# Patient Record
Sex: Female | Born: 1975
Health system: Southern US, Community
[De-identification: ages and names within clinical notes are randomized; demographics above are authoritative.]

## PROBLEM LIST (undated history)

## (undated) DIAGNOSIS — K2970 Gastritis, unspecified, without bleeding: Principal | ICD-10-CM

## (undated) DIAGNOSIS — B9681 Helicobacter pylori [H. pylori] as the cause of diseases classified elsewhere: Secondary | ICD-10-CM

## (undated) DIAGNOSIS — L309 Dermatitis, unspecified: Secondary | ICD-10-CM

## (undated) DIAGNOSIS — K219 Gastro-esophageal reflux disease without esophagitis: Secondary | ICD-10-CM

## (undated) HISTORY — DX: Helicobacter pylori (H. pylori) as the cause of diseases classified elsewhere: B96.81

## (undated) HISTORY — DX: Gastritis, unspecified, without bleeding: K29.70

## (undated) HISTORY — DX: Gastro-esophageal reflux disease without esophagitis: K21.9

## (undated) HISTORY — DX: Dermatitis, unspecified: L30.9

## (undated) HISTORY — PX: OTHER SURGICAL HISTORY: SHX169

---

## 2013-01-02 ENCOUNTER — Encounter: Payer: Self-pay | Admitting: Family Medicine

## 2013-01-02 ENCOUNTER — Ambulatory Visit (INDEPENDENT_AMBULATORY_CARE_PROVIDER_SITE_OTHER): Payer: BC Managed Care – PPO | Admitting: Family Medicine

## 2013-01-02 VITALS — BP 90/60 | HR 94 | Temp 99.2°F | Ht 58.75 in | Wt 100.8 lb

## 2013-01-02 DIAGNOSIS — L259 Unspecified contact dermatitis, unspecified cause: Secondary | ICD-10-CM

## 2013-01-02 DIAGNOSIS — M62830 Muscle spasm of back: Secondary | ICD-10-CM | POA: Insufficient documentation

## 2013-01-02 DIAGNOSIS — L309 Dermatitis, unspecified: Secondary | ICD-10-CM | POA: Insufficient documentation

## 2013-01-02 DIAGNOSIS — M5380 Other specified dorsopathies, site unspecified: Secondary | ICD-10-CM

## 2013-01-02 MED ORDER — CYCLOBENZAPRINE HCL 10 MG PO TABS: 10.0000 mg | ORAL_TABLET | Freq: Three times a day (TID) | ORAL | Status: DC | PRN

## 2013-01-02 MED ORDER — TRIAMCINOLONE ACETONIDE 0.1 % EX OINT: TOPICAL_OINTMENT | Freq: Two times a day (BID) | CUTANEOUS | Status: DC

## 2013-01-02 MED ORDER — MELOXICAM 15 MG PO TABS: 15.0000 mg | ORAL_TABLET | Freq: Every day | ORAL | Status: DC

## 2013-01-02 NOTE — Assessment & Plan Note (Signed)
New.  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  Start scheduled NSAIDs, muscle relaxer prn.  Heating pad prn.  Encouraged better posture if possible throughout the day.  Will follow.

## 2013-01-02 NOTE — Patient Instructions (Addendum)
Schedule your complete physical in 1-2 months Start the Triamcinolone ointment on the dry skin patches Use the Meloxicam daily for the back pain Use the flexeril (muscle relaxer) at night for pain and muscle spasm Use a heating pad for pain relief Try and improve your posture during the day- I know it's hard w/ work! Call with any questions or concerns Welcome!  We're glad to have you!

## 2013-01-02 NOTE — Progress Notes (Signed)
  Subjective:    Patient ID: Heather Caldwell, female    DOB: 10/16/1975, 37 y.o.   MRN: [id number]  HPI New to establish.  No previous MD.  Back pain- sxs started 'a long time ago' but recently worsened.  Will wake pt from sleep.  Located upper lumbar/lower thoracic.  Intermittent during the days but constant at night.  Worse w/ lying down.  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  Has not tried anything for pain.  Painful to bend forward.  No pain w/ extension.  Pain will radiate up into L shoulder.  Itchy skin- rash present, on bilateral buttocks, present x10 yrs.  No redness or drainage.  Has patches elsewhere.  Review of Systems For ROS see HPI     Objective:   Physical Exam  Vitals reviewed. Constitutional: She appears well-developed and well-nourished. No distress.  Cardiovascular: Normal rate, regular rhythm and normal heart sounds.   Pulmonary/Chest: Effort normal and breath sounds normal. No respiratory distress. She has no wheezes. She has no rales.  Musculoskeletal: She exhibits no edema.  + paraspinal spasm Pain w/ forward flexion, no pain w/ extension  Neurological: She has normal reflexes. No cranial nerve deficit. Coordination normal.  (-) SLR bilaterally  Skin: Skin is warm and dry.  Dry, scaly patches on buttock bilaterally, R upper arm, feet bilaterally  Psychiatric: She has a normal mood and affect. Her behavior is normal.          Assessment & Plan:

## 2013-01-02 NOTE — Assessment & Plan Note (Signed)
New.  Pt's dry, patchy skin consistent w/ eczema.  Start topical steroid ointment.  Reviewed supportive care and red flags that should prompt return.  Pt expressed understanding and is in agreement w/ plan.

## 2013-01-27 ENCOUNTER — Encounter (HOSPITAL_COMMUNITY): Payer: Self-pay | Admitting: *Deleted

## 2013-01-27 ENCOUNTER — Emergency Department (HOSPITAL_COMMUNITY)
Admission: EM | Admit: 2013-01-27 | Discharge: 2013-01-27 | Disposition: A | Discharge status: Home / Self Care | Payer: BC Managed Care – PPO | Source: Home / Self Care

## 2013-01-27 DIAGNOSIS — K209 Esophagitis, unspecified without bleeding: Secondary | ICD-10-CM

## 2013-01-27 DIAGNOSIS — K219 Gastro-esophageal reflux disease without esophagitis: Secondary | ICD-10-CM

## 2013-01-27 DIAGNOSIS — R1013 Epigastric pain: Secondary | ICD-10-CM

## 2013-01-27 LAB — POCT I-STAT, CHEM 8
BUN: 8 mg/dL (ref 6–23)
Calcium, Ion: 1.27 mmol/L — ABNORMAL HIGH (ref 1.12–1.23)
Chloride: 102 mEq/L (ref 96–112)
Glucose, Bld: 81 mg/dL (ref 70–99)

## 2013-01-27 LAB — POCT URINALYSIS DIP (DEVICE)
Ketones, ur: NEGATIVE mg/dL
Urobilinogen, UA: 0.2 mg/dL (ref 0.0–1.0)

## 2013-01-27 MED ORDER — OMEPRAZOLE 20 MG PO CPDR: 20.0000 mg | DELAYED_RELEASE_CAPSULE | Freq: Two times a day (BID) | ORAL | Status: DC

## 2013-01-27 MED ORDER — RANITIDINE HCL 150 MG PO TABS: 150.0000 mg | ORAL_TABLET | Freq: Two times a day (BID) | ORAL | Status: DC

## 2013-01-27 MED ORDER — ONDANSETRON HCL 4 MG PO TABS: 4.0000 mg | ORAL_TABLET | Freq: Four times a day (QID) | ORAL | Status: DC

## 2013-01-27 NOTE — ED Provider Notes (Signed)
History    CSN: [id number] Arrival date & time 01/27/13  1027  None    Chief Complaint  Patient presents with  . Abdominal Pain   (Consider location/radiation/quality/duration/timing/severity/associated sxs/prior Treatment) HPI Comments: 37 year old female from [place] is accompanied by a friend to interpret for her. Her chief complaint is that of high epigastric pain for greater than a year. In the past 2 weeks  getting worse. 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 She been treated with meloxicam and this seems to have made her epigastric pain worse. There is no radiation of pain. Is accompanied with nausea but no vomiting. Food tends to make it worse and when she feels hungry that tends to make it worse. She complains of acid reflux and heartburn moving up substernally. She is taking no medications for the symptoms. She complains of decreased appetite, early satiety and no vomiting. She denies urinary symptoms. She has had decreased intake of by mouth fluids and food and is feeling tired.  History reviewed. No pertinent past medical history. History reviewed. No pertinent past surgical history. No family history on file. History  Substance Use Topics  . Smoking status: Never Smoker   . Smokeless tobacco: Not on file  . Alcohol Use: No   OB History   Grav Para Term Preterm Abortions TAB SAB Ect Mult Living                 Review of Systems  Constitutional: Positive for activity change and fatigue. Negative for fever and chills.  HENT: Negative.   Respiratory: Negative.   Cardiovascular:       As above.  Gastrointestinal: Positive for nausea and abdominal pain. Negative for vomiting, diarrhea, blood in stool and abdominal distention.  Genitourinary: Negative for dysuria, frequency, hematuria and vaginal bleeding.  Musculoskeletal: Positive for back pain.  Skin: Negative.    Neurological: Positive for light-headedness. Negative for tremors, seizures, syncope, facial asymmetry and speech difficulty.    Allergies  Review of patient's allergies indicates no known allergies.  Home Medications   Current Outpatient Rx  Name  Route  Sig  Dispense  Refill  . triamcinolone ointment (KENALOG) 0.1 %   Topical   Apply topically 2 (two) times daily.   90 g   1   . cyclobenzaprine (FLEXERIL) 10 MG tablet   Oral   Take 1 tablet (10 mg total) by mouth 3 (three) times daily as needed for muscle spasms.   45 tablet   1   . meloxicam (MOBIC) 15 MG tablet   Oral   Take 1 tablet (15 mg total) by mouth daily.   30 tablet   0   . omeprazole (PRILOSEC) 20 MG capsule   Oral   Take 1 capsule (20 mg total) by mouth 2 (two) times daily.   40 capsule   0   . ondansetron (ZOFRAN) 4 MG tablet   Oral   Take 1 tablet (4 mg total) by mouth every 6 (six) hours.   12 tablet   0   . ranitidine (ZANTAC) 150 MG tablet   Oral   Take 1 tablet (150 mg total) by mouth 2 (two) times daily.   40 tablet   0    BP 108/80  Pulse 105  Temp(Src) 98.2 F (36.8 C) (Oral)  SpO2 100%  LMP 12/20/2012 Physical Exam  Nursing note and  vitals reviewed. Constitutional: She is oriented to person, place, and time. She appears well-developed and well-nourished. No distress.  Eyes: Conjunctivae and EOM are normal.  Neck: Normal range of motion. Neck supple.  Cardiovascular: Normal rate, regular rhythm and normal heart sounds.   Pulmonary/Chest: Effort normal and breath sounds normal. No respiratory distress. She has no wheezes.  Abdominal: Soft. She exhibits no distension and no mass. There is no rebound and no guarding.  Mild to moderate tenderness palpating the high epigastrium. No tenderness along the medial costal margins or the xiphoid. No tenderness in other areas of the abdomen. The abdomen is flat and symmetrical.  Musculoskeletal: She exhibits no edema and no tenderness.   Lymphadenopathy:    She has no cervical adenopathy.  Neurological: She is alert and oriented to person, place, and time. She exhibits normal muscle tone.  Skin: Skin is warm and dry.  Psychiatric: She has a normal mood and affect.    ED Course  Procedures (including critical care time) Labs Reviewed  POCT URINALYSIS DIP (DEVICE) - Abnormal; Notable for the following:    Hgb urine dipstick TRACE (*)    Leukocytes, UA TRACE (*)    All other components within normal limits  POCT I-STAT, CHEM 8 - Abnormal; Notable for the following:    Calcium, Ion 1.27 (*)    All other components within normal limits  POCT PREGNANCY, URINE   Results for orders placed during the hospital encounter of 01/27/13  POCT URINALYSIS DIP (DEVICE)      Result Value Range   Glucose, UA NEGATIVE  NEGATIVE mg/dL   Bilirubin Urine NEGATIVE  NEGATIVE   Ketones, ur NEGATIVE  NEGATIVE mg/dL   Specific Gravity, Urine 1.010  1.005 - 1.030   Hgb urine dipstick TRACE (*) NEGATIVE   pH 6.5  5.0 - 8.0   Protein, ur NEGATIVE  NEGATIVE mg/dL   Urobilinogen, UA 0.2  0.0 - 1.0 mg/dL   Nitrite NEGATIVE  NEGATIVE   Leukocytes, UA TRACE (*) NEGATIVE  POCT PREGNANCY, URINE      Result Value Range   Preg Test, Ur NEGATIVE  NEGATIVE  POCT I-STAT, CHEM 8      Result Value Range   Sodium 141  135 - 145 mEq/L   Potassium 3.6  3.5 - 5.1 mEq/L   Chloride 102  96 - 112 mEq/L   BUN 8  6 - 23 mg/dL   Creatinine, Ser 4.09  0.50 - 1.10 mg/dL   Glucose, Bld 81  70 - 99 mg/dL   Calcium, Ion 8.11 (*) 1.12 - 1.23 mmol/L   TCO2 24  0 - 100 mmol/L   Hemoglobin 13.3  12.0 - 15.0 g/dL   HCT 91.4  78.2 - 95.6 %    No results found. 1. Epigastric abdominal pain   2. GERD (gastroesophageal reflux disease)   3. Esophagitis     MDM  I suspect the patient has reflux esophagitis associated with some form of gastritis. She will be given a prescription for ranitidine 150 mg twice a day plus omeprazole 20 mg twice a day and Zantac 1  tablet every 6-8 hours as needed for nausea. She is encouraged to drink clear fluids stay well hydrated. Slowly advance diet as tolerated. No spicy, greasy or fast foods. Followup with your doctor as scheduled next week. Recommend he be referred to gastroenterology. He also may need additional blood testing, such as H. pylori. For any bleeding or new symptoms or severe pain or  fever may return.  Hayden Rasmussen, NP 01/27/13 1030

## 2013-01-27 NOTE — ED Notes (Signed)
Patient complains of abdominal pain and vomiting x 1 year.

## 2013-01-28 NOTE — ED Provider Notes (Signed)
Medical screening examination/treatment/procedure(s) were performed by resident physician or non-physician practitioner and as supervising physician I was immediately available for consultation/collaboration.   KINDL,JAMES DOUGLAS MD.   James D Kindl, MD 01/28/13 1340 

## 2013-03-04 ENCOUNTER — Encounter: Payer: Self-pay | Admitting: Family Medicine

## 2013-03-04 ENCOUNTER — Encounter: Payer: Self-pay | Admitting: General Practice

## 2013-03-04 ENCOUNTER — Other Ambulatory Visit (HOSPITAL_COMMUNITY)
Admission: RE | Admit: 2013-03-04 | Discharge: 2013-03-04 | Disposition: A | Discharge status: Home / Self Care | Payer: BC Managed Care – PPO | Source: Ambulatory Visit | Attending: Family Medicine | Admitting: Family Medicine

## 2013-03-04 ENCOUNTER — Ambulatory Visit (INDEPENDENT_AMBULATORY_CARE_PROVIDER_SITE_OTHER): Payer: BC Managed Care – PPO | Admitting: Family Medicine

## 2013-03-04 VITALS — BP 92/62 | HR 71 | Temp 98.0°F | Ht 58.75 in | Wt 102.0 lb

## 2013-03-04 DIAGNOSIS — Z1151 Encounter for screening for human papillomavirus (HPV): Secondary | ICD-10-CM | POA: Insufficient documentation

## 2013-03-04 DIAGNOSIS — K219 Gastro-esophageal reflux disease without esophagitis: Secondary | ICD-10-CM

## 2013-03-04 DIAGNOSIS — Z01419 Encounter for gynecological examination (general) (routine) without abnormal findings: Secondary | ICD-10-CM | POA: Insufficient documentation

## 2013-03-04 DIAGNOSIS — Z0000 Encounter for general adult medical examination without abnormal findings: Secondary | ICD-10-CM

## 2013-03-04 DIAGNOSIS — Z124 Encounter for screening for malignant neoplasm of cervix: Secondary | ICD-10-CM

## 2013-03-04 DIAGNOSIS — Z1331 Encounter for screening for depression: Secondary | ICD-10-CM

## 2013-03-04 LAB — HEPATIC FUNCTION PANEL
ALT: 18 U/L (ref 0–35)
AST: 21 U/L (ref 0–37)
Albumin: 3.8 g/dL (ref 3.5–5.2)
Alkaline Phosphatase: 49 U/L (ref 39–117)
Bilirubin, Direct: 0.1 mg/dL (ref 0.0–0.3)
Total Bilirubin: 0.7 mg/dL (ref 0.3–1.2)
Total Protein: 7.2 g/dL (ref 6.0–8.3)

## 2013-03-04 LAB — CBC WITH DIFFERENTIAL/PLATELET
Eosinophils Relative: 10 % — ABNORMAL HIGH (ref 0.0–5.0)
HCT: 34.9 % — ABNORMAL LOW (ref 36.0–46.0)
Hemoglobin: 11.9 g/dL — ABNORMAL LOW (ref 12.0–15.0)
Lymphs Abs: 1.6 10*3/uL (ref 0.7–4.0)
MCV: 92.7 fl (ref 78.0–100.0)
Monocytes Absolute: 0.4 10*3/uL (ref 0.1–1.0)
Neutro Abs: 4.1 10*3/uL (ref 1.4–7.7)
Platelets: 189 10*3/uL (ref 150.0–400.0)
RDW: 13 % (ref 11.5–14.6)
WBC: 6.9 10*3/uL (ref 4.5–10.5)

## 2013-03-04 LAB — LIPID PANEL
Cholesterol: 138 mg/dL (ref 0–200)
HDL: 32.2 mg/dL — ABNORMAL LOW (ref >39.00)
Total CHOL/HDL Ratio: 4
Triglycerides: 86 mg/dL (ref 0.0–149.0)

## 2013-03-04 LAB — BASIC METABOLIC PANEL
Calcium: 8.6 mg/dL (ref 8.4–10.5)
Creatinine, Ser: 0.5 mg/dL (ref 0.4–1.2)
GFR: 146.69 mL/min (ref >60.00)
Glucose, Bld: 95 mg/dL (ref 70–99)
Sodium: 134 mEq/L — ABNORMAL LOW (ref 135–145)

## 2013-03-04 LAB — TSH: TSH: 0.83 u[IU]/mL (ref 0.35–5.50)

## 2013-03-04 MED ORDER — TRIAMCINOLONE ACETONIDE 0.1 % EX OINT: TOPICAL_OINTMENT | Freq: Two times a day (BID) | CUTANEOUS | Status: DC

## 2013-03-04 MED ORDER — PANTOPRAZOLE SODIUM 40 MG PO TBEC: 40.0000 mg | DELAYED_RELEASE_TABLET | Freq: Every day | ORAL | Status: DC

## 2013-03-04 NOTE — Progress Notes (Signed)
  Subjective:    Patient ID: Heather Caldwell, female    DOB: [birth year], 37 y.o.   MRN: [id number]  HPI CPE- has never had pap smear   Review of Systems Patient reports no vision/ hearing changes, adenopathy,fever, weight change,  persistant/recurrent hoarseness , swallowing issues, chest pain, palpitations, edema, persistant/recurrent cough, hemoptysis, dyspnea (rest/exertional/paroxysmal nocturnal), gastrointestinal bleeding (melena, rectal bleeding), abdominal pain, bowel changes, GU symptoms (dysuria, hematuria, incontinence), Gyn symptoms (abnormal  bleeding, pain),  syncope, focal weakness, memory loss, numbness & tingling, hair/nail changes, abnormal bruising or bleeding, anxiety, or depression.  + GERD + Eczema    Objective:   Physical Exam  General Appearance:    Alert, cooperative, no distress, appears stated age  Head:    Normocephalic, without obvious abnormality, atraumatic  Eyes:    PERRL, conjunctiva/corneas clear, EOM's intact, fundi    benign, both eyes  Ears:    Normal TM's and external ear canals, both ears  Nose:   Nares normal, septum midline, mucosa normal, no drainage    or sinus tenderness  Throat:   Lips, mucosa, and tongue normal; teeth and gums normal  Neck:   Supple, symmetrical, trachea midline, no adenopathy;    Thyroid: no enlargement/tenderness/nodules  Back:     Symmetric, no curvature, ROM normal, no CVA tenderness  Lungs:     Clear to auscultation bilaterally, respirations unlabored  Chest Wall:    No tenderness or deformity   Heart:    Regular rate and rhythm, S1 and S2 normal, no murmur, rub   or gallop  Breast Exam:    No tenderness, masses, or nipple abnormality  Abdomen:     Soft, non-tender, bowel sounds active all four quadrants,    no masses, no organomegaly  Genitalia:    External genitalia normal, cervix friable/erythematous, no CMT, uterus in normal size and position, adnexa w/out mass or tenderness, mucosa pink and moist, no lesions or  discharge present  Rectal:    Normal external appearance  Extremities:   Extremities normal, atraumatic, no cyanosis or edema  Pulses:   2+ and symmetric all extremities  Skin:   Very dry, eczematous  Lymph nodes:   Cervical, supraclavicular, and axillary nodes normal  Neurologic:   CNII-XII intact, normal strength, sensation and reflexes    throughout          Assessment & Plan:

## 2013-03-04 NOTE — Assessment & Plan Note (Signed)
Pap collected. 

## 2013-03-04 NOTE — Patient Instructions (Addendum)
Start the Protonix daily for the stomach problems Use the Triamcinolone ointment on the dry skin twice daily We'll call you with your GI appt We'll notify you of your lab results and make any changes if needed Call with any questions or concerns Enjoy the rest of your summer!

## 2013-03-04 NOTE — Assessment & Plan Note (Signed)
Pt's PE WNL w/ exception of abnormal appearing cervix.  Pap collected.  Check labs.  Anticipatory guidance provided.

## 2013-03-04 NOTE — Assessment & Plan Note (Signed)
No improvement w/ Zantac or OTC Omeprazole.  Start Protonix daily.  Refer to GI for ongoing discomfort.

## 2013-03-06 ENCOUNTER — Encounter: Payer: Self-pay | Admitting: General Practice

## 2013-03-08 LAB — VITAMIN D 1,25 DIHYDROXY: Vitamin D2 1, 25 (OH)2: <8 pg/mL

## 2013-03-11 ENCOUNTER — Encounter: Payer: Self-pay | Admitting: *Deleted

## 2013-03-13 ENCOUNTER — Encounter: Payer: Self-pay | Admitting: Internal Medicine

## 2013-04-22 ENCOUNTER — Encounter: Payer: Self-pay | Admitting: Internal Medicine

## 2013-04-22 ENCOUNTER — Ambulatory Visit (INDEPENDENT_AMBULATORY_CARE_PROVIDER_SITE_OTHER): Payer: BC Managed Care – PPO | Admitting: Internal Medicine

## 2013-04-22 ENCOUNTER — Other Ambulatory Visit (INDEPENDENT_AMBULATORY_CARE_PROVIDER_SITE_OTHER): Payer: BC Managed Care – PPO

## 2013-04-22 VITALS — BP 102/62 | HR 80 | Ht 59.5 in | Wt 102.0 lb

## 2013-04-22 DIAGNOSIS — R110 Nausea: Secondary | ICD-10-CM

## 2013-04-22 DIAGNOSIS — G8929 Other chronic pain: Secondary | ICD-10-CM

## 2013-04-22 DIAGNOSIS — R1013 Epigastric pain: Secondary | ICD-10-CM

## 2013-04-22 LAB — AMYLASE: Amylase: 140 U/L — ABNORMAL HIGH (ref 27–131)

## 2013-04-22 LAB — LIPASE: Lipase: 26 U/L (ref 11.0–59.0)

## 2013-04-22 NOTE — Patient Instructions (Addendum)
Your physician has requested that you go to the basement for the following lab work before leaving today: Amylase, Lipase  You have been scheduled for an endoscopy with propofol. Please follow written instructions given to you at your visit today. If you use inhalers (even only as needed), please bring them with you on the day of your procedure. Your physician has requested that you go to www.startemmi.com and enter the access code given to you at your visit today. This web site gives a general overview about your procedure. However, you should still follow specific instructions given to you by our office regarding your preparation for the procedure.  You have been scheduled for an abdominal ultrasound at [facility] (1st floor of hospital) on 04/24/13 at 8:00am. Please arrive 15 minutes prior to your appointment for registration. Make certain not to have anything to eat or drink 6 hours prior to your appointment. Should you need to reschedule your appointment, please contact radiology at [phone]. This test typically takes about 30 minutes to perform.  It is the time of year to have a vaccination to prevent the flu (influenza virus).  Please have this done through your primary care provider or you can get this done at local pharmacies or the [facility]. It would be very helpful if you notify your primary care provider when and where you had the vaccination given by messaging them in My Chart, leaving a message or faxing the information.   I appreciate the opportunity to care for you.

## 2013-04-22 NOTE — Progress Notes (Signed)
Subjective:    Patient ID: Heather Caldwell, female    DOB: 1976/01/30, 37 y.o.   MRN: [id number]  HPI Is a very nice middle-aged [place] woman here with an interpreter. She is at a 3 year history of epigastric discomfort and heartburn. She also is complaining of some mid to lower back pain it does not seem to be associated with that. She saw Dr. to worry recently and Protonix was prescribed, the patient at this for about 3 weeks but stopped because it did not seem to help. The pain is intermittent and not associated with eating necessarily but can occur with that. She denies any significant stress or anxiety. She does have a 3 month history of intermittent headache which can make her feel nauseous and be associated with the pain but she's had these symptoms for 3 years prior to this. She might have lost a couple of pounds but in general has gone issue. Appetite is normal to slightly off. Her menses are regular her last menstrual period was September 17. She immigrated to this country 3 years ago. Her GI review of systems is otherwise negative, bowel habits are regular. No Known Allergies Outpatient Prescriptions Prior to Visit  Medication Sig Dispense Refill        . triamcinolone ointment (KENALOG) 0.1 % Apply topically 2 (two) times daily.  90 g  1   No facility-administered medications prior to visit.   Past Medical History  Diagnosis Date  . GERD (gastroesophageal reflux disease)     ??  . Eczema    History reviewed. No pertinent past surgical history. History   Social History  . Marital Status: Single    Spouse Name: N/A    Number of Children:  2 sons    Social History Main Topics  . Smoking status: Never Smoker   . Smokeless tobacco: Never Used  . Alcohol Use: No  . Drug Use: No    Social History Narrative   Single, 2 sons.   Employed as a Advertising account planner.   Family History  Problem Relation Age of Onset  . Family history unknown: Yes   Review of Systems As per  history of present illness. She's also complaining some itching on the left hip area. She does have some painful menstrual periods, fatigue. All other systems negative or as per history of present illness.    Objective:   Physical Exam General:  Well-developed, well-nourished and in no acute distress - petite Asian woman Eyes:  anicteric. ENT:   Mouth and posterior pharynx free of lesions.  Neck:   supple w/o thyromegaly or mass.  Lungs: Clear to auscultation bilaterally. Heart:  S1S2, no rubs, murmurs, gallops. Abdomen:  soft, mildly tender epigastrium no hepatosplenomegaly, hernia, or mass and BS+.  Lymph:  no cervical or supraclavicular adenopathy. Extremities:   no edema Skin   no rash. Neuro:  A&O x 3.  Psych:  appropriate mood and  Affect.   Data Reviewed: Primary care notes  Lab Results  Component Value Date   WBC 6.9 03/04/2013   HGB 11.9* 03/04/2013   HCT 34.9* 03/04/2013   MCV 92.7 03/04/2013   PLT 189.0 03/04/2013     Chemistry      Component Value Date/Time   NA 134* 03/04/2013 0914   K 3.8 03/04/2013 0914   CL 104 03/04/2013 0914   CO2 25 03/04/2013 0914   BUN 11 03/04/2013 0914   CREATININE 0.5 03/04/2013 0914      Component  Value Date/Time   CALCIUM 8.6 03/04/2013 0914   ALKPHOS 49 03/04/2013 0914   AST 21 03/04/2013 0914   ALT 18 03/04/2013 0914   BILITOT 0.7 03/04/2013 0914     Lab Results  Component Value Date   TSH 0.83 03/04/2013         Assessment & Plan:  Abdominal pain, chronic, epigastric  Nausea alone  1. Cause not clear - seems like dyspepsia. Severaal weeks of PPI no help. Labs have been ok except isolated slightly elevated ionized Ca last summer.  2. Will check amylase and lipase, keep additional calcium testing in mind but since repeat Ca was ok and the Ca i was just slightly high doubt that was problem 3. Abdominal US 4. EGD The risks and benefits as well as alternatives of endoscopic procedure(s) have been discussed and reviewed. All  questions answered. The patient agrees to proceed.  I appreciate the opportunity to care for this patient. CC: Neena Rhymes, MD

## 2013-04-24 ENCOUNTER — Ambulatory Visit (HOSPITAL_COMMUNITY)
Admission: RE | Admit: 2013-04-24 | Discharge: 2013-04-24 | Disposition: A | Discharge status: Home / Self Care | Payer: BC Managed Care – PPO | Source: Ambulatory Visit | Attending: Internal Medicine | Admitting: Internal Medicine

## 2013-04-24 DIAGNOSIS — R1013 Epigastric pain: Secondary | ICD-10-CM | POA: Insufficient documentation

## 2013-04-24 DIAGNOSIS — G8929 Other chronic pain: Secondary | ICD-10-CM | POA: Insufficient documentation

## 2013-04-24 NOTE — Progress Notes (Signed)
Quick Note:  [place] is ok Await egd ______

## 2013-05-13 ENCOUNTER — Ambulatory Visit (AMBULATORY_SURGERY_CENTER): Payer: BC Managed Care – PPO | Admitting: Internal Medicine

## 2013-05-13 ENCOUNTER — Encounter: Payer: Self-pay | Admitting: Internal Medicine

## 2013-05-13 VITALS — BP 100/74 | HR 77 | Temp 98.0°F | Resp 8 | Ht 59.0 in | Wt 102.0 lb

## 2013-05-13 DIAGNOSIS — R1013 Epigastric pain: Secondary | ICD-10-CM

## 2013-05-13 MED ORDER — SODIUM CHLORIDE 0.9 % IV SOLN: 500.0000 mL | INTRAVENOUS | Status: DC

## 2013-05-13 NOTE — Progress Notes (Signed)
Called to room to assist during endoscopic procedure.  Patient ID and intended procedure confirmed with present staff. Received instructions for my participation in the procedure from the performing physician.  

## 2013-05-13 NOTE — Progress Notes (Signed)
Procedure ends, to recovery, report given and VSS. 

## 2013-05-13 NOTE — Patient Instructions (Addendum)
This looked ok but I took biopsies to look for infection. We made an appointment for you to see me on the 3rd floor (my office) Nov 3 at 315 PM.  I appreciate the opportunity to care for you. Iva Boop, MD, FACG  YOU HAD AN ENDOSCOPIC PROCEDURE TODAY AT THE [facility]: Refer to the procedure report that was given to you for any specific questions about what was found during the examination.  If the procedure report does not answer your questions, please call your gastroenterologist to clarify.  If you requested that your care partner not be given the details of your procedure findings, then the procedure report has been included in a sealed envelope for you to review at your convenience later.  YOU SHOULD EXPECT: Some feelings of bloating in the abdomen. Passage of more gas than usual.  Walking can help get rid of the air that was put into your GI tract during the procedure and reduce the bloating. If you had a lower endoscopy (such as a colonoscopy or flexible sigmoidoscopy) you may notice spotting of blood in your stool or on the toilet paper. If you underwent a bowel prep for your procedure, then you may not have a normal bowel movement for a few days.  DIET: Your first meal following the procedure should be a light meal and then it is ok to progress to your normal diet.  A half-sandwich or bowl of soup is an example of a good first meal.  Heavy or fried foods are harder to digest and may make you feel nauseous or bloated.  Likewise meals heavy in dairy and vegetables can cause extra gas to form and this can also increase the bloating.  Drink plenty of fluids but you should avoid alcoholic beverages for 24 hours.  ACTIVITY: Your care partner should take you home directly after the procedure.  You should plan to take it easy, moving slowly for the rest of the day.  You can resume normal activity the day after the procedure however you should NOT DRIVE or use heavy machinery for 24  hours (because of the sedation medicines used during the test).    SYMPTOMS TO REPORT IMMEDIATELY: A gastroenterologist can be reached at any hour.  During normal business hours, 8:30 AM to 5:00 PM Monday through Friday, call [phone].  After hours and on weekends, please call the GI answering service at [phone] who will take a message and have the physician on call contact you.   Following upper endoscopy (EGD)  Vomiting of blood or coffee ground material  New chest pain or pain under the shoulder blades  Painful or persistently difficult swallowing  New shortness of breath  Fever of 100F or higher  Black, tarry-looking stools  FOLLOW UP: If any biopsies were taken you will be contacted by phone or by letter within the next 1-3 weeks.  Call your gastroenterologist if you have not heard about the biopsies in 3 weeks.  Our staff will call the home number listed on your records the next business day following your procedure to check on you and address any questions or concerns that you may have at that time regarding the information given to you following your procedure. This is a courtesy call and so if there is no answer at the home number and we have not heard from you through the emergency physician on call, we will assume that you have returned to your regular daily activities without  incident.  SIGNATURES/CONFIDENTIALITY: You and/or your care partner have signed paperwork which will be entered into your electronic medical record.  These signatures attest to the fact that that the information above on your After Visit Summary has been reviewed and is understood.  Full responsibility of the confidentiality of this discharge information lies with you and/or your care-partner.  Wait biopsy results.

## 2013-05-13 NOTE — Progress Notes (Signed)
Patient did not experience any of the following events: a burn prior to discharge; a fall within the facility; wrong site/side/patient/procedure/implant event; or a hospital transfer or hospital admission upon discharge from the facility. (G8907) Patient did not have preoperative order for IV antibiotic SSI prophylaxis. (G8918)  

## 2013-05-13 NOTE — Op Note (Signed)
[facility] [address] [place], [address]   ENDOSCOPY PROCEDURE REPORT  PATIENT: Heather, Caldwell  MR#: [id number] BIRTHDATE: February 08, 1976 , 37  yrs. old GENDER: Female ENDOSCOPIST: Iva Boop, MD, Clementeen Graham REFERRED BY:  Sheliah Hatch, M.D. PROCEDURE DATE:  05/13/2013 PROCEDURE:  EGD w/ biopsy ASA CLASS:     Class II INDICATIONS:  Epigastric pain. MEDICATIONS: Propofol (Diprivan) 180 mg IV, MAC sedation, administered by CRNA, and These medications were titrated to patient response per physician's verbal order TOPICAL ANESTHETIC: none  DESCRIPTION OF PROCEDURE: After the risks benefits and alternatives of the procedure were thoroughly explained, informed consent was obtained.  The LB [id number] endoscope was introduced through the mouth and advanced to the second portion of the duodenum. Without limitations.  The instrument was slowly withdrawn as the mucosa was fully examined.      The upper, middle and distal third of the esophagus were carefully inspected and no abnormalities were noted.  The z-line was well seen at the GEJ.  The endoscope was pushed into the fundus which was normal including a retroflexed view.  The antrum, gastric body, first and second part of the duodenum were unremarkable.  Biopsies fo CLOtestwere taken in the antrum and angularis.  Retroflexed views revealed no abnormalities.     The scope was then withdrawn from the patient and the procedure completed.  COMPLICATIONS: There were no complications. ENDOSCOPIC IMPRESSION: Normal EGD; biopsies were taken in the antrum and angularis (CLOtest) to look for H. pylori  RECOMMENDATIONS: Await biopsy results of CLO test and se me in office 05/20/2013 315 PM   eSigned:  Iva Boop, MD, [facility] 05/13/2013 3:19 PM   CC:The Patient and Sheliah Hatch, MD

## 2013-05-14 ENCOUNTER — Telehealth: Payer: Self-pay

## 2013-05-14 LAB — HELICOBACTER PYLORI SCREEN-BIOPSY: UREASE: POSITIVE

## 2013-05-14 NOTE — Telephone Encounter (Signed)
I called [phone] and spoke with the pt's sister, THU at # [phone].  She said she will call her sister and check on her.  I advised her to call us back if any questions or concerns, if not no need to call back.  She said she understood. Maw

## 2013-05-16 NOTE — Progress Notes (Signed)
Quick Note:  Will discuss and treat + H. Pylori test 11/3 She has appt  No letter/recall from [facility] ______

## 2013-05-20 ENCOUNTER — Ambulatory Visit (INDEPENDENT_AMBULATORY_CARE_PROVIDER_SITE_OTHER): Payer: BC Managed Care – PPO | Admitting: Internal Medicine

## 2013-05-20 ENCOUNTER — Encounter: Payer: Self-pay | Admitting: Internal Medicine

## 2013-05-20 VITALS — BP 90/60 | HR 88 | Ht 59.0 in | Wt 105.0 lb

## 2013-05-20 DIAGNOSIS — M545 Low back pain: Secondary | ICD-10-CM

## 2013-05-20 DIAGNOSIS — K219 Gastro-esophageal reflux disease without esophagitis: Secondary | ICD-10-CM

## 2013-05-20 DIAGNOSIS — A048 Other specified bacterial intestinal infections: Secondary | ICD-10-CM

## 2013-05-20 DIAGNOSIS — B9681 Helicobacter pylori [H. pylori] as the cause of diseases classified elsewhere: Secondary | ICD-10-CM

## 2013-05-20 HISTORY — DX: Helicobacter pylori (H. pylori) as the cause of diseases classified elsewhere: B96.81

## 2013-05-20 MED ORDER — OMEPRAZOLE 20 MG PO CPDR: 20.0000 mg | DELAYED_RELEASE_CAPSULE | Freq: Two times a day (BID) | ORAL | Status: AC

## 2013-05-20 MED ORDER — BIS SUBCIT-METRONID-TETRACYC 140-125-125 MG PO CAPS: 3.0000 | ORAL_CAPSULE | Freq: Three times a day (TID) | ORAL | Status: DC

## 2013-05-20 NOTE — Progress Notes (Signed)
  Subjective:    Patient ID: Heather Caldwell, female    DOB: [birth year], 37 y.o.   MRN: [id number]  HPI Patient is here with an interpreter. I saw her previously for epigastric pain, upper GI endoscopy has revealed H. pyloric gastritis. She still has her pain. 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 Medications, allergies, past medical history, past surgical history, family history and social history are reviewed and updated in the EMR.   Review of Systems As per history of present illness    Objective:   Physical Exam Well-developed Asian woman in no acute distress       Assessment & Plan:  Pylera prescription and given these instructions Followup as needed See PCP about back problems back exercises given

## 2013-05-20 NOTE — Patient Instructions (Signed)
Please make an appointment with Dr Beverely Low for your back pain.  Take pylera as prescribed.  Take omeprazole as prescribed.  Thank you for the opportunity to care for you.

## 2013-05-21 DIAGNOSIS — M545 Low back pain: Secondary | ICD-10-CM | POA: Insufficient documentation

## 2013-05-21 NOTE — Assessment & Plan Note (Signed)
If she truly has this could worsen with H. Pylori Rx

## 2013-05-21 NOTE — Assessment & Plan Note (Signed)
Will treat with Pylera and bid PPI x 10 days. She is to return if not better (prn) [place] Pylera instructions provided

## 2013-05-21 NOTE — Assessment & Plan Note (Signed)
Exercise info given See PCP

## 2013-06-20 ENCOUNTER — Ambulatory Visit: Payer: BC Managed Care – PPO | Admitting: Internal Medicine

## 2013-12-11 ENCOUNTER — Ambulatory Visit (INDEPENDENT_AMBULATORY_CARE_PROVIDER_SITE_OTHER): Payer: BC Managed Care – PPO | Admitting: Family Medicine

## 2013-12-11 ENCOUNTER — Encounter: Payer: Self-pay | Admitting: Family Medicine

## 2013-12-11 VITALS — BP 100/72 | HR 82 | Temp 98.0°F | Resp 16 | Wt 106.5 lb

## 2013-12-11 DIAGNOSIS — M545 Low back pain, unspecified: Secondary | ICD-10-CM

## 2013-12-11 MED ORDER — CYCLOBENZAPRINE HCL 10 MG PO TABS: 10.0000 mg | ORAL_TABLET | Freq: Three times a day (TID) | ORAL | Status: DC | PRN

## 2013-12-11 MED ORDER — MELOXICAM 15 MG PO TABS: 15.0000 mg | ORAL_TABLET | Freq: Every day | ORAL | Status: DC

## 2013-12-11 NOTE — Patient Instructions (Signed)
Follow up as needed Start the Meloxicam daily for pain and inflammation Use the cyclobenzaprine at night for spasm- will cause drowsiness If no improvement in pain in the next 2 weeks, please call and we'll send you to ortho for further evaluation Call with any questions or concerns Hang in there!

## 2013-12-11 NOTE — Assessment & Plan Note (Signed)
Chronic problem for pt.  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  Encouraged stretching, attention to posture.  Will start daily NSAIDs and muscle relaxer prn.  If no improvement will refer to ortho for additional evaluation/tx.  Pt expressed understanding and is in agreement w/ plan.

## 2013-12-11 NOTE — Progress Notes (Signed)
Pre visit review using our clinic review tool, if applicable. No additional management support is needed unless otherwise documented below in the visit note. 

## 2013-12-11 NOTE — Progress Notes (Signed)
   Subjective:    Patient ID: Heather Caldwell, female    DOB: 08/14/1975, 38 y.o.   MRN: [id number]  HPI LBP- sxs started 3 yrs ago.  Pain is constant but worse after menses.  Pain worse w/ lying flat.  Pain improves w/ IcyHot patches.  No radiation of pain into butt or legs.  Does not take pain meds or NSAIDs for this.  Some L thigh pain w/ position changes.   Review of Systems For ROS see HPI     Objective:   Physical Exam  Vitals reviewed. Constitutional: She is oriented to person, place, and time. She appears well-developed and well-nourished. No distress.  Cardiovascular: Intact distal pulses.   Musculoskeletal: She exhibits no edema.  No TTP over spine + TTP over L paraspinal muscles and lat Pain w/ back extension > flexion  Neurological: She is alert and oriented to person, place, and time. She has normal reflexes. No cranial nerve deficit. Coordination normal.  (-) SLR bilaterally  Skin: Skin is warm and dry.          Assessment & Plan:

## 2016-04-26 ENCOUNTER — Encounter (HOSPITAL_COMMUNITY): Payer: Self-pay

## 2016-04-26 ENCOUNTER — Inpatient Hospital Stay (HOSPITAL_COMMUNITY)
Admission: AD | Admit: 2016-04-26 | Discharge: 2016-04-26 | Disposition: A | Discharge status: Home / Self Care | Payer: BLUE CROSS/BLUE SHIELD | Source: Ambulatory Visit | Attending: Obstetrics and Gynecology | Admitting: Obstetrics and Gynecology

## 2016-04-26 DIAGNOSIS — N920 Excessive and frequent menstruation with regular cycle: Secondary | ICD-10-CM | POA: Diagnosis not present

## 2016-04-26 DIAGNOSIS — N939 Abnormal uterine and vaginal bleeding, unspecified: Secondary | ICD-10-CM | POA: Diagnosis present

## 2016-04-26 LAB — URINALYSIS, ROUTINE W REFLEX MICROSCOPIC
Bilirubin Urine: NEGATIVE
GLUCOSE, UA: NEGATIVE mg/dL
KETONES UR: NEGATIVE mg/dL
LEUKOCYTES UA: NEGATIVE
Nitrite: NEGATIVE
PROTEIN: NEGATIVE mg/dL
Specific Gravity, Urine: 1.015 (ref 1.005–1.030)
pH: 6.5 (ref 5.0–8.0)

## 2016-04-26 LAB — CBC
HCT: 32.4 % — ABNORMAL LOW (ref 36.0–46.0)
Hemoglobin: 11.1 g/dL — ABNORMAL LOW (ref 12.0–15.0)
MCH: 31.3 pg (ref 26.0–34.0)
MCHC: 34.3 g/dL (ref 30.0–36.0)
MCV: 91.3 fL (ref 78.0–100.0)
PLATELETS: 161 10*3/uL (ref 150–400)
RBC: 3.55 MIL/uL — AB (ref 3.87–5.11)
RDW: 12.7 % (ref 11.5–15.5)
WBC: 4.8 10*3/uL (ref 4.0–10.5)

## 2016-04-26 LAB — URINE MICROSCOPIC-ADD ON: WBC, UA: NONE SEEN WBC/hpf (ref 0–5)

## 2016-04-26 LAB — WET PREP, GENITAL
Clue Cells Wet Prep HPF POC: NONE SEEN
Sperm: NONE SEEN
Trich, Wet Prep: NONE SEEN
YEAST WET PREP: NONE SEEN

## 2016-04-26 LAB — POCT PREGNANCY, URINE: PREG TEST UR: NEGATIVE

## 2016-04-26 NOTE — MAU Note (Signed)
Pt states her periods have been heavier than normal for the last two years.  She soaks through a pad every one or two hours.  She has large clots and they last for about 5-6 days.  She says her head and back hurts with it as well.

## 2016-04-26 NOTE — Discharge Instructions (Signed)
Rong huy?t (Metrorrhagia) Rong huy?t l tnh tr?ng ch?y mu t? t? cung x?y ra khng theo chu k? nh?ng th??ng xuyn. Tnh tr?ng ch?y mu th??ng x?y ra ? kho?ng gi?a cc k? kinh nguy?t. H??NG D?N CH?M [place] T?I NH Ch  ??n nh?ng thay ??i v? tri?u ch?ng c?a qu v?. Lm theo nh?ng h??ng d?n sau ?? gip c?i thi?n tnh tr?ng c?a qu v?: ?n  ?n nh?ng th?c ?n cn b?ng. ?n nh?ng th?c ?n giu ch?t s?t, ch?ng h?n nh? gan, th?t, ??ng v?t c v?, rau l xanh v tr?ng.  N?u qu v? b? to bn:  U?ng th?t nhi?u n??c.  ?n nh?ng lo?i tri cy v rau c nhi?u n??c v ch?t x?, ch?ng h?n rau bina (spinach), c r?t, qu? mm xi, to v xoi. Thu?c  Ch? s? d?ng thu?c khng c?n k ??n v thu?c c?n k ??n theo ch? d?n c?a chuyn gia ch?m [place] s?c kh?e.  Khng thay ??i thu?c m khng h?i  ki?n c?a chuyn gia ch?m [place] s?c kh?e.  Aspirin ho?c cc thu?c c ch?a aspirin c th? lm ch?y mu n?ng h?n. Khng dng nh?ng lo?i thu?c ?:  Trong tu?n tr??c khi ??n k? kinh nguy?t.  Trong k? kinh nguy?t.  N?u qu v? ???c k vin thu?c s?t, hy dng thu?c theo ch? d?n c?a chuyn gia ch?m [place] s?c kh?e. Vin thu?c s?t gip thay th? l??ng s?t m c? th? qu v? ? b? m?t do tnh tr?ng ny. Ho?t ??ng  N?u qu v? c?n thay b?ng v? sinh ho?c nt b?ng v? sinh nhi?u l?n trong m?i 2 gi?:  N?m trn gi??ng v k cao chn (nng cao).  ??t m?t ti ch??m l?nh ln ph?n b?ng d??i c?a qu v?.  Ngh? ng?i cng nhi?u cng t?t cho ??n khi ch?y mu d?ng l?i ho?c ch?m l?i.  Khng c? g?ng gi?m cn cho ??n khi tnh tr?ng ch?y mu d?ng l?i v n?ng ?? s?t trong mu c?a qu v? tr? l?i bnh th??ng. Nh?ng h??ng d?n khc  Trong hai thng, ghi l?i:  Khi no chu k? kinh nguy?n c?a qu v? b?t ??u.  Khi no chu k? kinh nguy?t c?a qu v? k?t thc.  Khi no vi?c ch?y mu b?t th??ng x?y ra.  Qu v? nh?n th?y v?n ?? g.  Tun th? t?t c? cc cu?c h?n khm l?i theo ch? d?n c?a chuyn gia ch?m [place] s?c kh?e. ?i?u ny c vai tr quan tr?ng. ?I KHM  N?U:  Qu v? b? chong vng ho?c y?u ?t.  Qu v? b? bu?n nn v nn m?a.  Qu v? khng th? ?n ho?c u?ng m khng b? nn.  Qu v? c?m th?y chng m?t ho?c b? tiu ch?y khi qu v? ?ang dng thu?c.  Qu v? ?ang dng vin thu?c trnh [place] ho?c hoc-mn, v qu v? mu?n thay th? ho?c d?ng s? d?ng chng. NGAY L?P T?C ?I KHM N?U:  Qu v? b? s?t ho?c ?n l?nh.  Qu v? c?n thay b?ng v? sinh ho?c nt b?ng v? sinh ho?c nt b?ng v? sinh nhi?u l?n trong m?i gi?:  Tnh tr?ng ch?y mutr? thnh n?ng.  Dng mu c?a qu v? c ch?a cc c?c mu ?ng.  Qu v? b? ?au b?ng.  Qu v? b? b?t t?nh.  Qu v? b? pht ban.   Thng tin ny khng nh?m m?c ?ch thay th? cho l?i khuyn m chuyn gia ch?m [place] s?c kh?e ni v?i qu v?. Hy b?o ??m  qu v? ph?i th?o lu?n b?t k? v?n ?? g m qu v? c v?i chuyn gia ch?m [place] s?c kh?e c?a qu v?.   Document Released: 03/16/2011 Document Revised: 03/25/2015 Elsevier Interactive Patient Education [employer]

## 2016-04-26 NOTE — MAU Provider Note (Signed)
History     CSN: [id number]  Arrival date and time: 04/26/16 [id number]   First Provider Initiated Contact with Patient 04/26/16 1014      Chief Complaint  Patient presents with  . Vaginal Bleeding   G3P2012, non-pregnant female here with c/o heavy menses x2 years. She reports menses q20 days with 5-6 days of flow. Flow is heavy days 1-3 with changing overnight pads q2 hrs then changing regular pads q3 hrs days 5-6. She reports passing some quarter sized clots. She denies cramping and pain. LMP was 5 days ago. She is sexually active. She is not using contraception. She denies vaginal discharge, itching, and odor. She reports HA and back pain associated with meses. She uses OTC meds for her HA. [facility]     Past Medical History:  Diagnosis Date  . Eczema   . GERD (gastroesophageal reflux disease)    ??  . Helicobacter pylori gastritis 05/20/2013    Past Surgical History:  Procedure Laterality Date  . none      Family History  Problem Relation Age of Onset  . Colon cancer Neg Hx   . Esophageal cancer Neg Hx   . Stomach cancer Neg Hx     Social History  Substance Use Topics  . Smoking status: Never Smoker  . Smokeless tobacco: Never Used  . Alcohol use No    Allergies: No Known Allergies  Prescriptions Prior to Admission  Medication Sig Dispense Refill Last Dose  . cyclobenzaprine (FLEXERIL) 10 MG tablet Take 1 tablet (10 mg total) by mouth 3 (three) times daily as needed for muscle spasms. 45 tablet 1   . meloxicam (MOBIC) 15 MG tablet Take 1 tablet (15 mg total) by mouth daily. 30 tablet 1     Review of Systems  Constitutional: Negative.   Gastrointestinal: Negative.   Musculoskeletal: Positive for back pain.  Neurological: Positive for headaches.   Physical Exam   Blood pressure 100/69, pulse 71, temperature 98.4 F (36.9 C), temperature source Oral, resp. rate 16.  Physical Exam  Constitutional: She is  oriented to person, place, and time. She appears well-developed and well-nourished.  HENT:  Head: Normocephalic and atraumatic.  Neck: Normal range of motion. Neck supple.  Cardiovascular: Normal rate.   Respiratory: Effort normal.  GI: Soft. She exhibits no distension. There is no tenderness.  Genitourinary:  Genitourinary Comments: External: no lesions Vagina: rugated, parous, scant bloody discharge, surface of cervix noted to bleed easy after gentle contact with speculum, ectropian present Uterus: non enlarged, anteverted, non tender, no CMT Adnexae: no masses, no tenderness left, no tenderness right   Musculoskeletal: Normal range of motion.  Neurological: She is alert and oriented to person, place, and time.  Skin: Skin is warm and dry.  Psychiatric: She has a normal mood and affect.   Results for orders placed or performed during the hospital encounter of 04/26/16 (from the past 24 hour(s))  Urinalysis, Routine w reflex microscopic (not [facility])     Status: Abnormal   Collection Time: 04/26/16  9:30 AM  Result Value Ref Range   Color, Urine YELLOW YELLOW   APPearance CLEAR CLEAR   Specific Gravity, Urine 1.015 1.005 - 1.030   pH 6.5 5.0 - 8.0   Glucose, UA NEGATIVE NEGATIVE mg/dL   Hgb urine dipstick MODERATE (A) NEGATIVE   Bilirubin Urine NEGATIVE NEGATIVE   Ketones, ur NEGATIVE NEGATIVE mg/dL   Protein, ur NEGATIVE NEGATIVE  mg/dL   Nitrite NEGATIVE NEGATIVE   Leukocytes, UA NEGATIVE NEGATIVE  Urine microscopic-add on     Status: Abnormal   Collection Time: 04/26/16  9:30 AM  Result Value Ref Range   Squamous Epithelial / LPF 0-5 (A) NONE SEEN   WBC, UA NONE SEEN 0 - 5 WBC/hpf   RBC / HPF 6-30 0 - 5 RBC/hpf   Bacteria, UA RARE (A) NONE SEEN  Pregnancy, urine POC     Status: None   Collection Time: 04/26/16 10:11 AM  Result Value Ref Range   Preg Test, Ur NEGATIVE NEGATIVE  Wet prep, genital     Status: Abnormal   Collection Time: 04/26/16 10:30 AM  Result Value  Ref Range   Yeast Wet Prep HPF POC NONE SEEN NONE SEEN   Trich, Wet Prep NONE SEEN NONE SEEN   Clue Cells Wet Prep HPF POC NONE SEEN NONE SEEN   WBC, Wet Prep HPF POC FEW (A) NONE SEEN   Sperm NONE SEEN   CBC     Status: Abnormal   Collection Time: 04/26/16 10:31 AM  Result Value Ref Range   WBC 4.8 4.0 - 10.5 K/uL   RBC 3.55 (L) 3.87 - 5.11 MIL/uL   Hemoglobin 11.1 (L) 12.0 - 15.0 g/dL   HCT 16.1 (L) 09.6 - 04.5 %   MCV 91.3 78.0 - 100.0 fL   MCH 31.3 26.0 - 34.0 pg   MCHC 34.3 30.0 - 36.0 g/dL   RDW 40.9 81.1 - 91.4 %   Platelets 161 150 - 400 K/uL    MAU Course  Procedures  MDM Labs ordered and reviewed. No evidence of anemia. No evidence of acute pelvic process. Stable for discharge home.  Assessment and Plan   1. Menorrhagia with regular cycle    Discharge home Pelvic US outpatient Follow up with [facility] in 1-2 weeks   Donette Larry, CNM 04/26/2016, 10:48 AM

## 2016-04-27 LAB — GC/CHLAMYDIA PROBE AMP (~~LOC~~) NOT AT ARMC
Chlamydia: NEGATIVE
Neisseria Gonorrhea: NEGATIVE

## 2016-05-09 ENCOUNTER — Ambulatory Visit (HOSPITAL_COMMUNITY)
Admission: RE | Admit: 2016-05-09 | Discharge: 2016-05-09 | Disposition: A | Discharge status: Home / Self Care | Payer: BLUE CROSS/BLUE SHIELD | Source: Ambulatory Visit | Attending: Certified Nurse Midwife | Admitting: Certified Nurse Midwife

## 2016-05-09 DIAGNOSIS — N920 Excessive and frequent menstruation with regular cycle: Secondary | ICD-10-CM | POA: Diagnosis present

## 2016-06-01 ENCOUNTER — Emergency Department (HOSPITAL_BASED_OUTPATIENT_CLINIC_OR_DEPARTMENT_OTHER): Payer: BLUE CROSS/BLUE SHIELD

## 2016-06-01 ENCOUNTER — Encounter (HOSPITAL_BASED_OUTPATIENT_CLINIC_OR_DEPARTMENT_OTHER): Payer: Self-pay

## 2016-06-01 ENCOUNTER — Emergency Department (HOSPITAL_BASED_OUTPATIENT_CLINIC_OR_DEPARTMENT_OTHER)
Admission: EM | Admit: 2016-06-01 | Discharge: 2016-06-01 | Disposition: A | Discharge status: Home / Self Care | Payer: BLUE CROSS/BLUE SHIELD | Attending: Emergency Medicine | Admitting: Emergency Medicine

## 2016-06-01 DIAGNOSIS — R1013 Epigastric pain: Secondary | ICD-10-CM | POA: Insufficient documentation

## 2016-06-01 DIAGNOSIS — R519 Headache, unspecified: Secondary | ICD-10-CM

## 2016-06-01 DIAGNOSIS — R0602 Shortness of breath: Secondary | ICD-10-CM | POA: Diagnosis not present

## 2016-06-01 DIAGNOSIS — R51 Headache: Secondary | ICD-10-CM | POA: Diagnosis not present

## 2016-06-01 DIAGNOSIS — R05 Cough: Secondary | ICD-10-CM | POA: Diagnosis present

## 2016-06-01 DIAGNOSIS — M545 Low back pain, unspecified: Secondary | ICD-10-CM

## 2016-06-01 DIAGNOSIS — R059 Cough, unspecified: Secondary | ICD-10-CM

## 2016-06-01 DIAGNOSIS — R112 Nausea with vomiting, unspecified: Secondary | ICD-10-CM | POA: Diagnosis not present

## 2016-06-01 LAB — CBC WITH DIFFERENTIAL/PLATELET
Basophils Absolute: 0 10*3/uL (ref 0.0–0.1)
Basophils Relative: 0 %
Eosinophils Absolute: 0 10*3/uL (ref 0.0–0.7)
Eosinophils Relative: 0 %
HCT: 31.9 % — ABNORMAL LOW (ref 36.0–46.0)
Hemoglobin: 10.7 g/dL — ABNORMAL LOW (ref 12.0–15.0)
Lymphocytes Relative: 7 %
Lymphs Abs: 0.5 10*3/uL — ABNORMAL LOW (ref 0.7–4.0)
MCH: 31.7 pg (ref 26.0–34.0)
MCHC: 33.5 g/dL (ref 30.0–36.0)
MCV: 94.4 fL (ref 78.0–100.0)
Monocytes Absolute: 0.3 10*3/uL (ref 0.1–1.0)
Monocytes Relative: 4 %
Neutro Abs: 6.7 10*3/uL (ref 1.7–7.7)
Neutrophils Relative %: 89 %
Platelets: 190 10*3/uL (ref 150–400)
RBC: 3.38 MIL/uL — ABNORMAL LOW (ref 3.87–5.11)
RDW: 12.4 % (ref 11.5–15.5)
WBC: 7.5 10*3/uL (ref 4.0–10.5)

## 2016-06-01 LAB — COMPREHENSIVE METABOLIC PANEL
ALT: 20 U/L (ref 14–54)
AST: 25 U/L (ref 15–41)
Albumin: 3.9 g/dL (ref 3.5–5.0)
Alkaline Phosphatase: 43 U/L (ref 38–126)
Anion gap: 6 (ref 5–15)
BUN: 11 mg/dL (ref 6–20)
CO2: 21 mmol/L — ABNORMAL LOW (ref 22–32)
Calcium: 8.4 mg/dL — ABNORMAL LOW (ref 8.9–10.3)
Chloride: 106 mmol/L (ref 101–111)
Creatinine, Ser: 0.45 mg/dL (ref 0.44–1.00)
GFR calc Af Amer: >60 mL/min (ref >60)
GFR calc non Af Amer: >60 mL/min (ref >60)
Glucose, Bld: 106 mg/dL — ABNORMAL HIGH (ref 65–99)
Potassium: 3.4 mmol/L — ABNORMAL LOW (ref 3.5–5.1)
Sodium: 133 mmol/L — ABNORMAL LOW (ref 135–145)
Total Bilirubin: 0.3 mg/dL (ref 0.3–1.2)
Total Protein: 7.1 g/dL (ref 6.5–8.1)

## 2016-06-01 LAB — URINALYSIS, ROUTINE W REFLEX MICROSCOPIC
Bilirubin Urine: NEGATIVE
GLUCOSE, UA: NEGATIVE mg/dL
Hgb urine dipstick: NEGATIVE
Ketones, ur: NEGATIVE mg/dL
NITRITE: NEGATIVE
PROTEIN: NEGATIVE mg/dL
Specific Gravity, Urine: 1.022 (ref 1.005–1.030)
pH: 8 (ref 5.0–8.0)

## 2016-06-01 LAB — PREGNANCY, URINE: Preg Test, Ur: NEGATIVE

## 2016-06-01 LAB — URINE MICROSCOPIC-ADD ON

## 2016-06-01 LAB — LIPASE, BLOOD: Lipase: 23 U/L (ref 11–51)

## 2016-06-01 MED ORDER — ONDANSETRON HCL 4 MG/2ML IJ SOLN
4.0000 mg | Freq: Once | INTRAMUSCULAR | Status: AC
  Administered 2016-06-01: 4 mg via INTRAVENOUS
  Filled 2016-06-01: qty 2

## 2016-06-01 MED ORDER — IBUPROFEN 800 MG PO TABS: 800.0000 mg | ORAL_TABLET | Freq: Three times a day (TID) | ORAL | 0 refills | Status: AC

## 2016-06-01 MED ORDER — BENZONATATE 100 MG PO CAPS: 100.0000 mg | ORAL_CAPSULE | Freq: Three times a day (TID) | ORAL | 0 refills | Status: AC

## 2016-06-01 MED ORDER — SODIUM CHLORIDE 0.9 % IV BOLUS (SEPSIS)
1000.0000 mL | Freq: Once | INTRAVENOUS | Status: AC
  Administered 2016-06-01: 1000 mL via INTRAVENOUS

## 2016-06-01 MED ORDER — ONDANSETRON HCL 4 MG PO TABS: 4.0000 mg | ORAL_TABLET | Freq: Four times a day (QID) | ORAL | 0 refills | Status: AC

## 2016-06-01 MED ORDER — SODIUM CHLORIDE 0.9 % IV BOLUS (SEPSIS): 1000.0000 mL | Freq: Once | INTRAVENOUS | Status: DC

## 2016-06-01 MED FILL — IBUPROFEN 800 MG TABLET: 800 | 7 days supply | Qty: 21 | Fill #0

## 2016-06-01 MED FILL — BENZONATATE 100 MG CAPSULE: 100 | 7 days supply | Qty: 21 | Fill #0

## 2016-06-01 MED FILL — ONDANSETRON HCL 4 MG TABLET: 4 | 3 days supply | Qty: 12 | Fill #0

## 2016-06-01 NOTE — ED Provider Notes (Signed)
[facility] Provider Note   CSN: [id number] Arrival date & time: 06/01/16  1119     History   Chief Complaint Chief Complaint  Patient presents with  . Cough    HPI Heather Caldwell is a 40 y.o. female reports a 1 day history of nonproductive cough, nausea, vomiting, sore throat, headache, abdominal pain. Patient reports associated shortness of breath. Patient reports not being able to keep anything down since this morning. Patient reports insomnia and associated chest tiredness when she has not been getting enough sleep. She has been experiencing this for the past year, but it has been worse since she has started feeling bad. Patient reports chronic low back pain that has been worse and she began vomiting. Patient has generalized fatigue. She denies any fevers, but has had chills. Patient reports epigastric pain intermittent for a year, that has been worsening since she has not been feeling well. Patient denies any recent travel or sick contacts. She denies any urinary symptoms.  HPI  Past Medical History:  Diagnosis Date  . Eczema   . GERD (gastroesophageal reflux disease)    ??  . Helicobacter pylori gastritis 05/20/2013    Patient Active Problem List   Diagnosis Date Noted  . Low back pain 05/21/2013  . Helicobacter pylori gastritis 05/20/2013  . Screening for malignant neoplasm of the cervix 03/04/2013  . Routine gynecological examination 03/04/2013  . GERD (gastroesophageal reflux disease) 03/04/2013  . Eczema 01/02/2013  . Paraspinal muscle spasm 01/02/2013    Past Surgical History:  Procedure Laterality Date  . none      OB History    Gravida Para Term Preterm AB Living   3 2 2   1 2    SAB TAB Ectopic Multiple Live Births   1               Home Medications    Prior to Admission medications   Medication Sig Start Date End Date Taking? Authorizing Provider  benzonatate (TESSALON) 100 MG capsule Take 1 capsule (100 mg total) by mouth every 8  (eight) hours. 06/01/16   Emi Holes, PA-C  ibuprofen (ADVIL,MOTRIN) 800 MG tablet Take 1 tablet (800 mg total) by mouth 3 (three) times daily. 06/01/16   Emi Holes, PA-C  Multiple Vitamin (MULTIVITAMIN WITH MINERALS) TABS tablet Take 1 tablet by mouth daily.    Historical Provider, MD  Omega-3 Fatty Acids (FISH OIL PO) Take 1 tablet by mouth daily.    Historical Provider, MD  ondansetron (ZOFRAN) 4 MG tablet Take 1 tablet (4 mg total) by mouth every 6 (six) hours. 06/01/16   Doranne Schmutz M Meshilem Machuca, PA-C  OVER THE COUNTER MEDICATION Take 1 tablet by mouth daily. Pt takes this medication for her eyes. (like a vitamin for the eyes)    Historical Provider, MD    Family History Family History  Problem Relation Age of Onset  . Colon cancer Neg Hx   . Esophageal cancer Neg Hx   . Stomach cancer Neg Hx     Social History Social History  Substance Use Topics  . Smoking status: Never Smoker  . Smokeless tobacco: Never Used  . Alcohol use No     Allergies   Patient has no known allergies.   Review of Systems Review of Systems  Constitutional: Positive for appetite change and chills. Negative for fever.  HENT: Negative for facial swelling and sore throat.   Respiratory: Positive for cough and shortness of breath.  Cardiovascular: Negative for chest pain.  Gastrointestinal: Positive for abdominal pain, nausea and vomiting.  Genitourinary: Negative for dysuria.  Musculoskeletal: Positive for back pain.  Skin: Negative for rash and wound.  Neurological: Positive for headaches.  Psychiatric/Behavioral: The patient is not nervous/anxious.      Physical Exam Updated Vital Signs BP 114/82   Pulse 81   Temp 98 F (36.7 C) (Oral)   Resp 20   Ht 5' (1.524 m)   Wt 45.4 kg   LMP 05/18/2016   SpO2 100%   BMI 19.53 kg/m   Physical Exam  Constitutional: She appears well-developed and well-nourished. No distress.  HENT:  Head: Normocephalic and atraumatic.  Mouth/Throat:  Oropharynx is clear and moist. No oropharyngeal exudate.  Eyes: Conjunctivae and EOM are normal. Pupils are equal, round, and reactive to light. Right eye exhibits no discharge. Left eye exhibits no discharge. No scleral icterus.  Neck: Normal range of motion. Neck supple. No thyromegaly present.  Cardiovascular: Normal rate, regular rhythm, normal heart sounds and intact distal pulses.  Exam reveals no gallop and no friction rub.   No murmur heard. Pulmonary/Chest: Effort normal and breath sounds normal. No stridor. No respiratory distress. She has no wheezes. She has no rales.  Abdominal: Soft. Bowel sounds are normal. She exhibits no distension. There is tenderness in the right upper quadrant, epigastric area, periumbilical area and left upper quadrant. There is no rebound, no guarding, no CVA tenderness and no tenderness at McBurney's point.  Musculoskeletal: She exhibits no edema.       Lumbar back: She exhibits tenderness and bony tenderness.       Back:  Lymphadenopathy:    She has no cervical adenopathy.  Neurological: She is alert. Coordination normal.  CN 3-12 intact; normal sensation throughout; 5/5 strength in all 4 extremities; equal bilateral grip strength   Skin: Skin is warm and dry. No rash noted. She is not diaphoretic. No pallor.  Psychiatric: She has a normal mood and affect.  Nursing note and vitals reviewed.    ED Treatments / Results  Labs (all labs ordered are listed, but only abnormal results are displayed) Labs Reviewed  URINALYSIS, ROUTINE W REFLEX MICROSCOPIC (NOT AT [facility]) - Abnormal; Notable for the following:       Result Value   Leukocytes, UA SMALL (*)    All other components within normal limits  URINE MICROSCOPIC-ADD ON - Abnormal; Notable for the following:    Squamous Epithelial / LPF 6-30 (*)    Bacteria, UA FEW (*)    All other components within normal limits  COMPREHENSIVE METABOLIC PANEL - Abnormal; Notable for the following:    Sodium 133  (*)    Potassium 3.4 (*)    CO2 21 (*)    Glucose, Bld 106 (*)    Calcium 8.4 (*)    All other components within normal limits  CBC WITH DIFFERENTIAL/PLATELET - Abnormal; Notable for the following:    RBC 3.38 (*)    Hemoglobin 10.7 (*)    HCT 31.9 (*)    Lymphs Abs 0.5 (*)    All other components within normal limits  PREGNANCY, URINE  LIPASE, BLOOD    EKG  EKG Interpretation None       Radiology Dg Chest 2 View  Result Date: 06/01/2016 CLINICAL DATA:  Cough and shortness of breath for 2 days EXAM: CHEST  2 VIEW COMPARISON:  None. FINDINGS: Lungs are clear. Heart size and pulmonary vascularity are normal. No adenopathy. No  bone lesions. IMPRESSION: No edema or consolidation. Electronically Signed   By: Bretta BangWilliam  Woodruff III M.D.   On: 06/01/2016 15:05   Ct L-spine No Charge  Result Date: 06/01/2016 CLINICAL DATA:  Lumbosacral back pain. Cough, body aches and dizziness. EXAM: CT LUMBAR SPINE WITHOUT CONTRAST TECHNIQUE: Multidetector CT imaging of the lumbar spine was performed without intravenous contrast administration. Multiplanar CT image reconstructions were also generated. Images reformatted from concurrently performed CT abdomen/pelvis without contrast, to be reported separately. COMPARISON:  None. FINDINGS: Segmentation: 5 lumbar type vertebrae. Alignment: Normal. Vertebrae: No acute fracture or focal pathologic process. No bony destructive change. Paraspinal and other soft tissues: Negative. CT abdomen/pelvis reported separately. Disc levels: Normal disc height. No evidence for significant disc protrusion. IMPRESSION: No acute osseous abnormality of the lumbar spine. Electronically Signed   By: Rubye OaksMelanie  Ehinger M.D.   On: 06/01/2016 13:59   Ct Renal Stone Study  Result Date: 06/01/2016 CLINICAL DATA:  Nonproductive cough.  Body aches. EXAM: CT ABDOMEN AND PELVIS WITHOUT CONTRAST TECHNIQUE: Multidetector CT imaging of the abdomen and pelvis was performed following the  standard protocol without IV contrast. COMPARISON:  None. FINDINGS: Lower chest: No acute abnormality. Hepatobiliary: No focal liver abnormality is seen. No gallstones, gallbladder wall thickening, or biliary dilatation. Pancreas: Unremarkable. No pancreatic ductal dilatation or surrounding inflammatory changes. Spleen: Normal in size without focal abnormality. Adrenals/Urinary Tract: Adrenal glands are unremarkable. Kidneys are normal, without renal calculi, focal lesion, or hydronephrosis. Bladder is unremarkable. Stomach/Bowel: Stomach is within normal limits. Appendix appears normal. No evidence of bowel wall thickening, distention, or inflammatory changes. Vascular/Lymphatic: No significant vascular findings are present. No enlarged abdominal or pelvic lymph nodes. Reproductive: Uterus and bilateral adnexa are unremarkable. Other: No abdominal wall hernia or abnormality. No abdominopelvic ascites. Musculoskeletal: No acute or significant osseous findings. IMPRESSION: 1. No acute abdominal or pelvic pathology. Electronically Signed   By: Elige KoHetal  Patel   On: 06/01/2016 13:56    Procedures Procedures (including critical care time)  Medications Ordered in ED Medications  sodium chloride 0.9 % bolus 1,000 mL (not administered)  sodium chloride 0.9 % bolus 1,000 mL (0 mLs Intravenous Stopped 06/01/16 1549)  ondansetron (ZOFRAN) injection 4 mg (4 mg Intravenous Given 06/01/16 1309)  ondansetron (ZOFRAN) injection 4 mg (4 mg Intravenous Given 06/01/16 1546)     Initial Impression / Assessment and Plan / ED Course  I have reviewed the triage vital signs and the nursing notes.  Pertinent labs & imaging results that were available during my care of the patient were reviewed by me and considered in my medical decision making (see chart for details).  Clinical Course     Patient with most likely viral syndrome. Patient reporting she is feeling a little better after Zofran and 2 L of fluid in the ED.  CBC shows stable chronic anemia, hemoglobin 10.7. Lipase 23. CMP shows sodium 133, potassium 3.4, CO2 21, glucose 106, calcium 8.4. CT of the abdomen renal stone study shows no acute abdominal or pelvic pathology. CT of the L-spine shows no osseous abnormality. CXR shows no edema or consolidation. UA shows small leukocytes, few bacteria, 6-30 squamous epithelial cells. Urine pregnancy negative. Patient able to tolerate fluids in the ED. Discharged home with supportive treatment including Zofran, Tessalon, ibuprofen. Follow-up to PCP for follow-up and further evaluation. Follow-up to GI for chronic upper abdominal pain. Return precautions discussed. Patient understands and agrees with plan. Patient vitals stable throughout ED course and discharged in satisfactory condition.  Final Clinical  Impressions(s) / ED Diagnoses   Final diagnoses:  Low back pain  Cough  Non-intractable vomiting with nausea, unspecified vomiting type  Acute nonintractable headache, unspecified headache type    New Prescriptions New Prescriptions   BENZONATATE (TESSALON) 100 MG CAPSULE    Take 1 capsule (100 mg total) by mouth every 8 (eight) hours.   IBUPROFEN (ADVIL,MOTRIN) 800 MG TABLET    Take 1 tablet (800 mg total) by mouth 3 (three) times daily.   ONDANSETRON (ZOFRAN) 4 MG TABLET    Take 1 tablet (4 mg total) by mouth every 6 (six) hours.     Emi Holes, PA-C 06/01/16 1551    Melene Plan, DO 06/02/16 [phone]

## 2016-06-01 NOTE — Discharge Instructions (Signed)
Medications: Zofran, Tessalon, ibuprofen  Treatment: Take Zofran every 6 hours as needed for nausea and vomiting. Take Tessalon every 8 hours as needed for cough. Take ibuprofen every 8 hours as needed for pain and body aches. Make sure to drink plenty of fluids.  Follow-up: Please follow-up with your primary care provider for follow-up of today's visit. Please follow-up with gastroenterology for further evaluation of your long-term upper abdominal pain. Please return to emergency department if you developed any new or worsening symptoms.

## 2016-06-01 NOTE — ED Triage Notes (Signed)
Per sister, pt with nonprod cough, body aches, n/v, dizziness since yesterday-sent from urgent care-negative flu-NAD-steady gait

## 2016-06-01 NOTE — ED Notes (Signed)
Patient transported to X-ray 

## 2018-02-03 IMAGING — DX DG CHEST 2V
2 series · 2 of 2 positions shown · non-contrast
Comparison: None.

CLINICAL DATA: Cough and shortness of breath for 2 days

EXAM:
CHEST  2 VIEW

[chest pa]
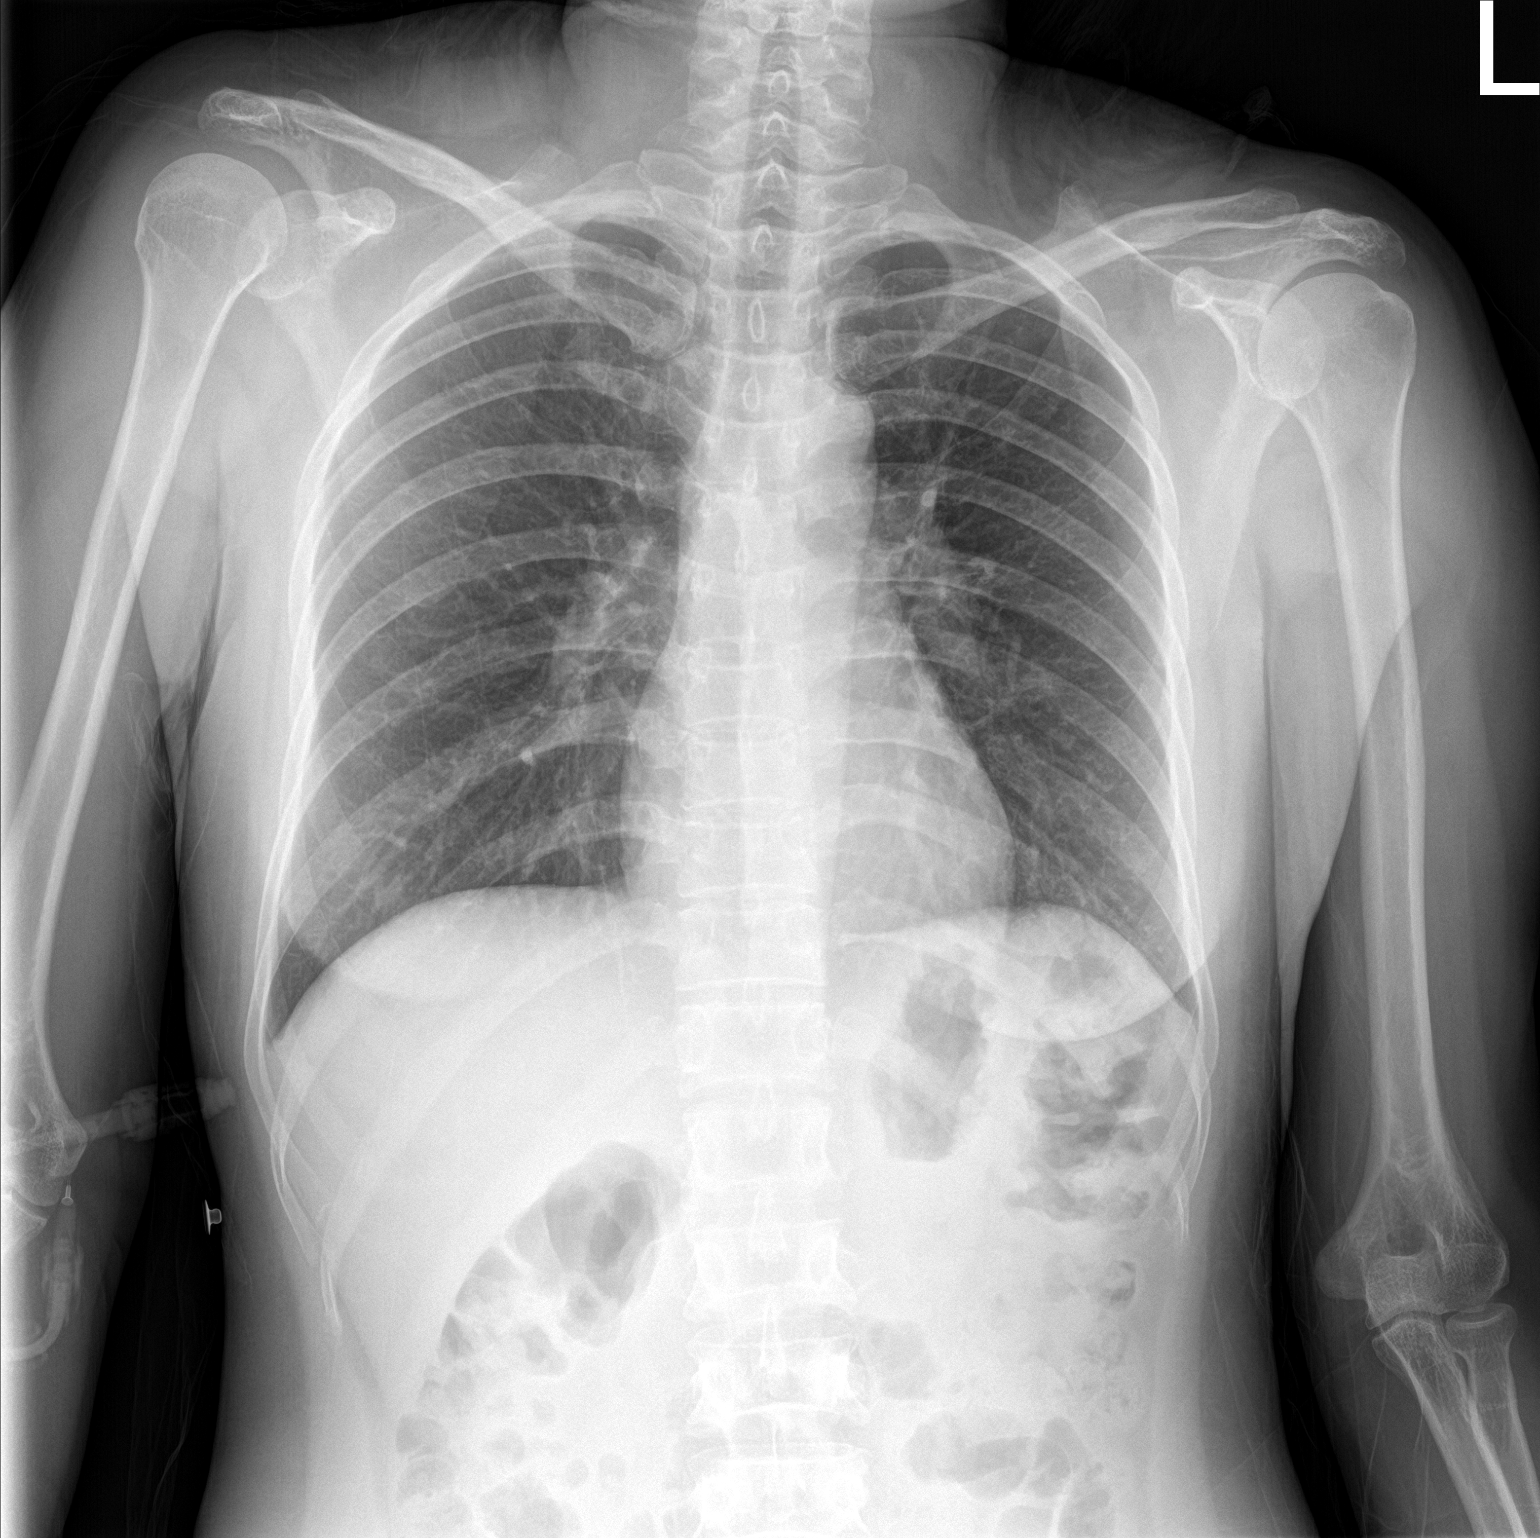

[chest lat]
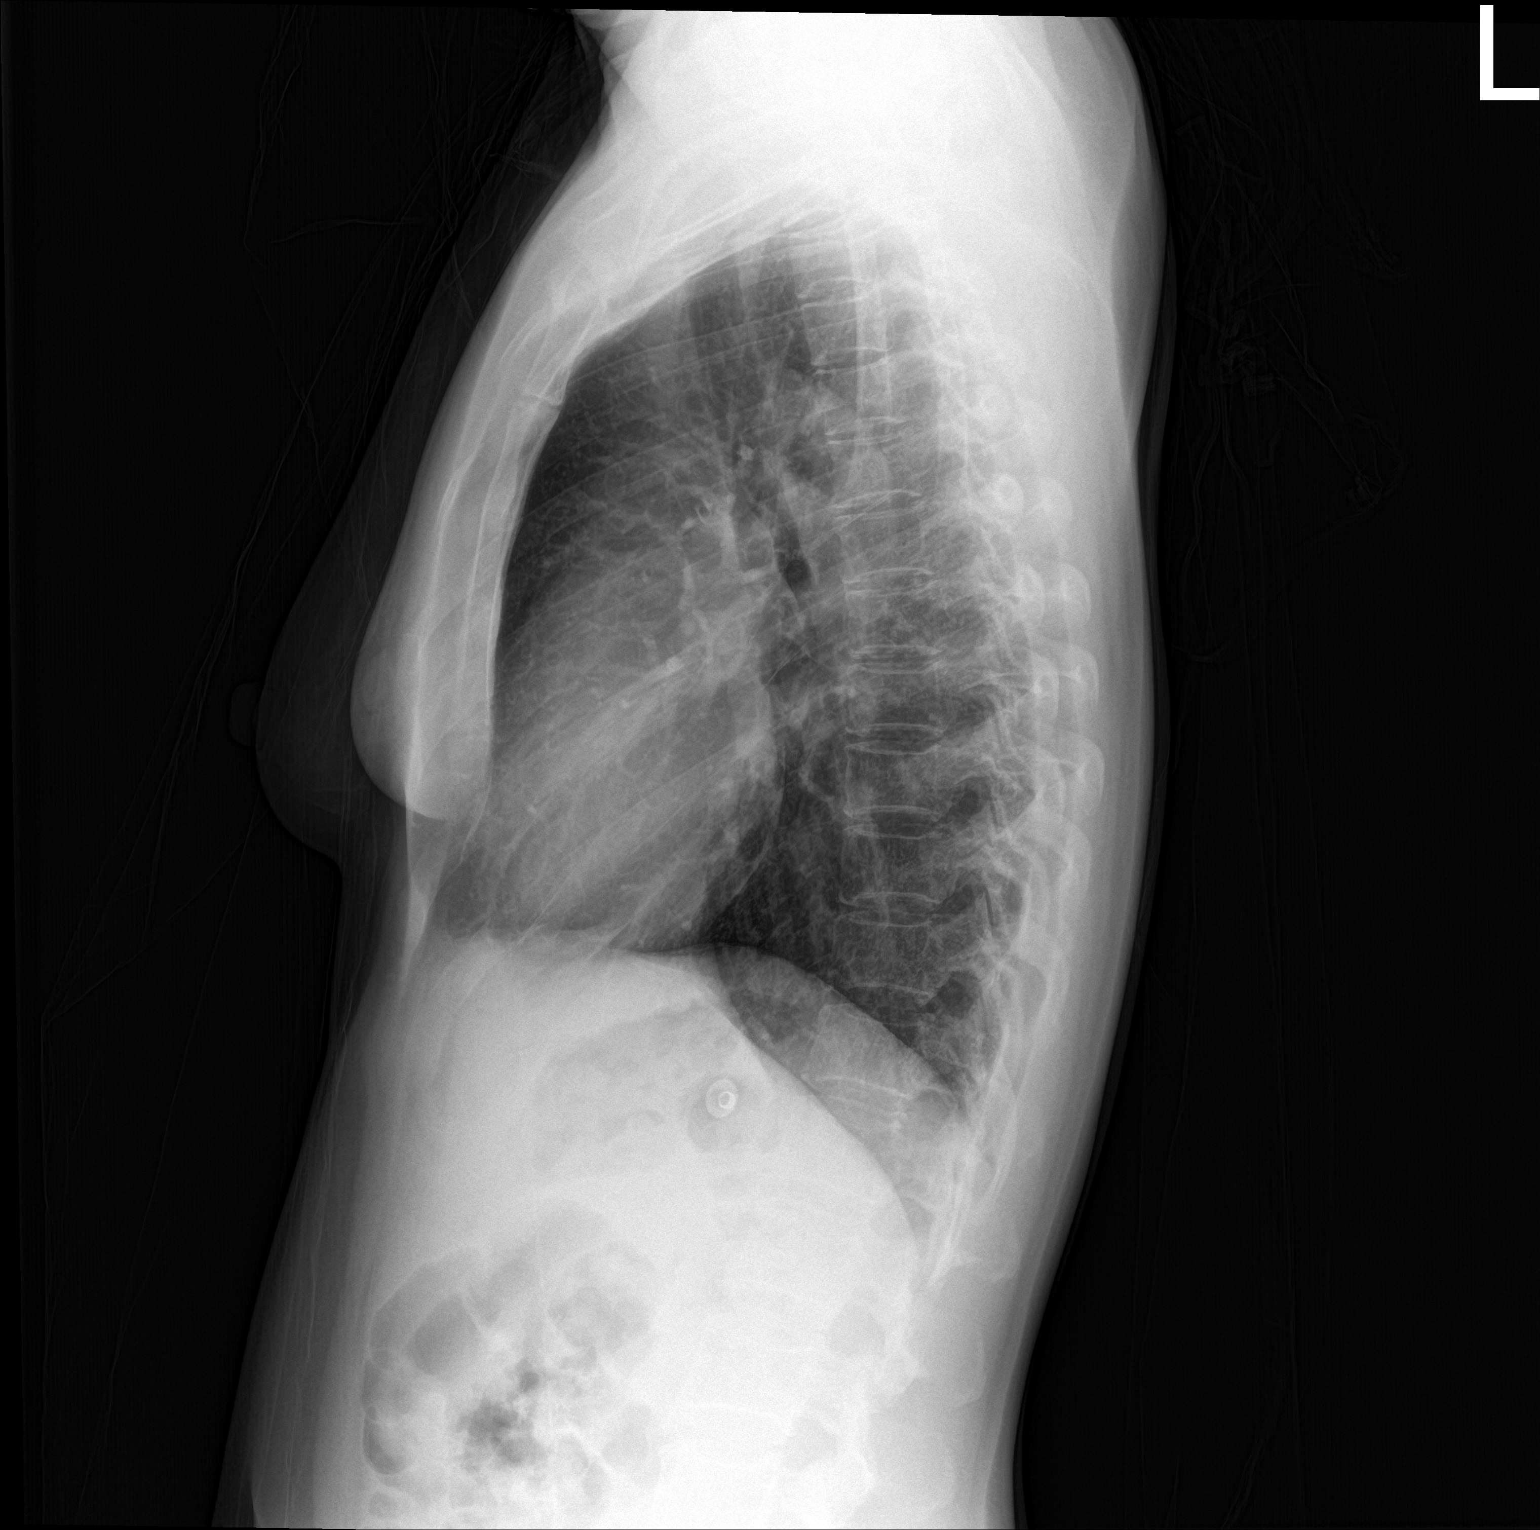

[2 of 2 positions shown; findings below may reference images not displayed]

FINDINGS: Lungs are clear. Heart size and pulmonary vascularity are normal. No
adenopathy. No bone lesions.
IMPRESSION: No edema or consolidation.
